# Patient Record
Sex: Female | Born: 1970 | Race: White | Hispanic: No | Smoking: Never smoker
Health system: Southern US, Community
[De-identification: ages and names within clinical notes are randomized; demographics above are authoritative.]

## PROBLEM LIST (undated history)

## (undated) ENCOUNTER — Inpatient Hospital Stay (HOSPITAL_COMMUNITY): Payer: 59

## (undated) DIAGNOSIS — F419 Anxiety disorder, unspecified: Secondary | ICD-10-CM

## (undated) DIAGNOSIS — R51 Headache: Secondary | ICD-10-CM

## (undated) DIAGNOSIS — R519 Headache, unspecified: Secondary | ICD-10-CM

## (undated) DIAGNOSIS — J45909 Unspecified asthma, uncomplicated: Secondary | ICD-10-CM

## (undated) HISTORY — PX: HERNIA REPAIR: SHX51

## (undated) HISTORY — PX: CERVICAL ABLATION: SHX5771

---

## 1995-07-27 HISTORY — PX: TUBAL LIGATION: SHX77

## 1996-07-26 HISTORY — PX: FOOT FRACTURE SURGERY: SHX645

## 1998-05-12 ENCOUNTER — Encounter: Admission: RE | Admit: 1998-05-12 | Discharge: 1998-05-12 | Payer: Self-pay | Admitting: Family Medicine

## 1998-05-27 ENCOUNTER — Encounter: Admission: RE | Admit: 1998-05-27 | Discharge: 1998-05-27 | Payer: Self-pay | Admitting: Sports Medicine

## 1998-07-14 ENCOUNTER — Encounter: Admission: RE | Admit: 1998-07-14 | Discharge: 1998-07-14 | Payer: Self-pay | Admitting: Sports Medicine

## 1998-07-14 ENCOUNTER — Other Ambulatory Visit: Admission: RE | Admit: 1998-07-14 | Discharge: 1998-07-14 | Payer: Self-pay | Admitting: *Deleted

## 1998-07-24 ENCOUNTER — Encounter: Admission: RE | Admit: 1998-07-24 | Discharge: 1998-07-24 | Payer: Self-pay | Admitting: Family Medicine

## 1998-07-29 ENCOUNTER — Encounter: Admission: RE | Admit: 1998-07-29 | Discharge: 1998-07-29 | Payer: Self-pay | Admitting: Sports Medicine

## 1998-08-26 ENCOUNTER — Encounter: Admission: RE | Admit: 1998-08-26 | Discharge: 1998-08-26 | Payer: Self-pay | Admitting: Family Medicine

## 1998-08-26 ENCOUNTER — Other Ambulatory Visit: Admission: RE | Admit: 1998-08-26 | Discharge: 1998-08-26 | Payer: Self-pay | Admitting: Family Medicine

## 1998-09-09 ENCOUNTER — Encounter: Admission: RE | Admit: 1998-09-09 | Discharge: 1998-09-09 | Payer: Self-pay | Admitting: Sports Medicine

## 1998-09-16 ENCOUNTER — Encounter: Admission: RE | Admit: 1998-09-16 | Discharge: 1998-09-16 | Payer: Self-pay | Admitting: Sports Medicine

## 1998-09-29 ENCOUNTER — Encounter: Admission: RE | Admit: 1998-09-29 | Discharge: 1998-09-29 | Payer: Self-pay | Admitting: Family Medicine

## 1998-10-28 ENCOUNTER — Encounter: Admission: RE | Admit: 1998-10-28 | Discharge: 1998-10-28 | Payer: Self-pay | Admitting: Family Medicine

## 1998-11-20 ENCOUNTER — Encounter: Admission: RE | Admit: 1998-11-20 | Discharge: 1998-11-20 | Payer: Self-pay | Admitting: Family Medicine

## 1999-01-14 ENCOUNTER — Encounter: Admission: RE | Admit: 1999-01-14 | Discharge: 1999-01-14 | Payer: Self-pay | Admitting: *Deleted

## 1999-02-17 ENCOUNTER — Encounter: Admission: RE | Admit: 1999-02-17 | Discharge: 1999-02-17 | Payer: Self-pay | Admitting: Family Medicine

## 1999-03-26 ENCOUNTER — Encounter: Admission: RE | Admit: 1999-03-26 | Discharge: 1999-03-26 | Payer: Self-pay | Admitting: Family Medicine

## 1999-04-07 ENCOUNTER — Encounter: Admission: RE | Admit: 1999-04-07 | Discharge: 1999-04-07 | Payer: Self-pay | Admitting: Family Medicine

## 1999-04-14 ENCOUNTER — Encounter: Admission: RE | Admit: 1999-04-14 | Discharge: 1999-04-14 | Payer: Self-pay | Admitting: Family Medicine

## 1999-05-15 ENCOUNTER — Encounter: Admission: RE | Admit: 1999-05-15 | Discharge: 1999-05-15 | Payer: Self-pay | Admitting: Family Medicine

## 1999-06-22 ENCOUNTER — Encounter: Admission: RE | Admit: 1999-06-22 | Discharge: 1999-06-22 | Payer: Self-pay | Admitting: Family Medicine

## 1999-06-23 ENCOUNTER — Encounter: Admission: RE | Admit: 1999-06-23 | Discharge: 1999-06-23 | Payer: Self-pay | Admitting: Family Medicine

## 1999-06-30 ENCOUNTER — Encounter: Admission: RE | Admit: 1999-06-30 | Discharge: 1999-06-30 | Payer: Self-pay | Admitting: Family Medicine

## 1999-08-18 ENCOUNTER — Other Ambulatory Visit: Admission: RE | Admit: 1999-08-18 | Discharge: 1999-08-18 | Payer: Self-pay | Admitting: Family Medicine

## 1999-08-18 ENCOUNTER — Encounter: Admission: RE | Admit: 1999-08-18 | Discharge: 1999-08-18 | Payer: Self-pay | Admitting: Family Medicine

## 2002-05-11 ENCOUNTER — Emergency Department (HOSPITAL_COMMUNITY): Admission: EM | Admit: 2002-05-11 | Discharge: 2002-05-11 | Payer: Self-pay | Admitting: Emergency Medicine

## 2002-05-11 ENCOUNTER — Encounter: Payer: Self-pay | Admitting: Emergency Medicine

## 2002-10-08 ENCOUNTER — Encounter: Payer: Self-pay | Admitting: Emergency Medicine

## 2002-10-08 ENCOUNTER — Emergency Department (HOSPITAL_COMMUNITY): Admission: EM | Admit: 2002-10-08 | Discharge: 2002-10-08 | Payer: Self-pay | Admitting: Emergency Medicine

## 2010-04-05 ENCOUNTER — Emergency Department (HOSPITAL_COMMUNITY): Admission: EM | Admit: 2010-04-05 | Discharge: 2010-04-05 | Payer: Self-pay | Admitting: Emergency Medicine

## 2011-03-18 ENCOUNTER — Emergency Department (HOSPITAL_COMMUNITY): Payer: 59

## 2011-03-18 ENCOUNTER — Emergency Department (HOSPITAL_COMMUNITY)
Admission: EM | Admit: 2011-03-18 | Discharge: 2011-03-18 | Disposition: A | Discharge status: Home / Self Care | Payer: 59 | Attending: Emergency Medicine | Admitting: Emergency Medicine

## 2011-03-18 ENCOUNTER — Encounter (HOSPITAL_COMMUNITY): Payer: Self-pay

## 2011-03-18 DIAGNOSIS — Y92410 Unspecified street and highway as the place of occurrence of the external cause: Secondary | ICD-10-CM | POA: Insufficient documentation

## 2011-03-18 DIAGNOSIS — I10 Essential (primary) hypertension: Secondary | ICD-10-CM | POA: Insufficient documentation

## 2011-03-18 DIAGNOSIS — K1370 Unspecified lesions of oral mucosa: Secondary | ICD-10-CM | POA: Insufficient documentation

## 2011-03-18 DIAGNOSIS — R110 Nausea: Secondary | ICD-10-CM | POA: Insufficient documentation

## 2011-03-18 DIAGNOSIS — K089 Disorder of teeth and supporting structures, unspecified: Secondary | ICD-10-CM | POA: Insufficient documentation

## 2011-03-18 DIAGNOSIS — T1490XA Injury, unspecified, initial encounter: Secondary | ICD-10-CM | POA: Insufficient documentation

## 2011-03-18 DIAGNOSIS — R109 Unspecified abdominal pain: Secondary | ICD-10-CM | POA: Insufficient documentation

## 2011-03-18 DIAGNOSIS — M25539 Pain in unspecified wrist: Secondary | ICD-10-CM | POA: Insufficient documentation

## 2011-03-18 DIAGNOSIS — M25529 Pain in unspecified elbow: Secondary | ICD-10-CM | POA: Insufficient documentation

## 2011-03-18 DIAGNOSIS — R079 Chest pain, unspecified: Secondary | ICD-10-CM | POA: Insufficient documentation

## 2011-03-18 MED ORDER — IOHEXOL 300 MG/ML  SOLN
100.0000 mL | Freq: Once | INTRAMUSCULAR | Status: AC | PRN
  Administered 2011-03-18: 100 mL via INTRAVENOUS

## 2011-06-01 ENCOUNTER — Encounter (HOSPITAL_COMMUNITY): Payer: Self-pay | Admitting: *Deleted

## 2011-07-07 ENCOUNTER — Encounter (HOSPITAL_COMMUNITY): Payer: Self-pay

## 2011-07-13 ENCOUNTER — Inpatient Hospital Stay (HOSPITAL_COMMUNITY): Admission: RE | Admit: 2011-07-13 | Payer: 59 | Source: Ambulatory Visit

## 2011-07-15 NOTE — H&P (Addendum)
Isabella Baker is an 40 y.o. female. G3P3 who is presenting for endometrial ablation for h/o dysmenorrhea and menorrhagia with heavy painful cycles lasting up to 10 days.  She has had a prior BTL.  Pertinent Gynecological History: OB History NSVD x 3   Menstrual History: Menarche age: 86 Patient's last menstrual period was 05/24/2011.    Past Medical History  Diagnosis Date  . Anxiety     Past Surgical History  Procedure Date  . Foot fracture surgery 1998  . Hernia repair     childhood  . Tubal ligation 1997    No family history on file.  Social History:  reports that she has never smoked. She does not have any smokeless tobacco history on file. She reports that she does not use illicit drugs. Her alcohol history not on file.  Allergies: No Known Allergies  No prescriptions prior to admission    ROS  Last menstrual period 05/24/2011. Physical Exam  Constitutional: She is oriented to person, place, and time. She appears well-developed and well-nourished.  Cardiovascular: Normal rate and regular rhythm.   Respiratory: Effort normal and breath sounds normal.  GI: Soft. Bowel sounds are normal.  Genitourinary: Vagina normal and uterus normal.  Neurological: She is alert and oriented to person, place, and time.  Psychiatric: She has a normal mood and affect. Her behavior is normal.    No results found for this or any previous visit (from the past 24 hour(s)).  No results found.  Assessment/Plan: Pt counseled re: procedure and risks/benfits.  She understands risks of bleeding infection and possible dmage to bowel and bladder and desires to proceed.  Isabella Baker 07/15/2011, 9:40 PM  Per pt no changes in dictated H&P, brief exam WNL.

## 2011-07-16 ENCOUNTER — Encounter (HOSPITAL_COMMUNITY): Payer: Self-pay | Admitting: Anesthesiology

## 2011-07-16 ENCOUNTER — Ambulatory Visit (HOSPITAL_COMMUNITY): Payer: 59 | Admitting: Anesthesiology

## 2011-07-16 ENCOUNTER — Other Ambulatory Visit: Payer: Self-pay | Admitting: Obstetrics and Gynecology

## 2011-07-16 ENCOUNTER — Encounter (HOSPITAL_COMMUNITY)
Admission: RE | Disposition: A | Discharge status: Home / Self Care | Payer: Self-pay | Source: Ambulatory Visit | Attending: Obstetrics and Gynecology

## 2011-07-16 ENCOUNTER — Ambulatory Visit (HOSPITAL_COMMUNITY)
Admission: RE | Admit: 2011-07-16 | Discharge: 2011-07-16 | Disposition: A | Discharge status: Home / Self Care | Payer: 59 | Source: Ambulatory Visit | Attending: Obstetrics and Gynecology | Admitting: Obstetrics and Gynecology

## 2011-07-16 DIAGNOSIS — Z9889 Other specified postprocedural states: Secondary | ICD-10-CM

## 2011-07-16 DIAGNOSIS — N946 Dysmenorrhea, unspecified: Secondary | ICD-10-CM | POA: Insufficient documentation

## 2011-07-16 DIAGNOSIS — N920 Excessive and frequent menstruation with regular cycle: Secondary | ICD-10-CM | POA: Insufficient documentation

## 2011-07-16 HISTORY — DX: Anxiety disorder, unspecified: F41.9

## 2011-07-16 LAB — CBC
HCT: 28.5 % — ABNORMAL LOW (ref 36.0–46.0)
Hemoglobin: 8.6 g/dL — ABNORMAL LOW (ref 12.0–15.0)
MCH: 23.4 pg — ABNORMAL LOW (ref 26.0–34.0)
MCV: 77.4 fL — ABNORMAL LOW (ref 78.0–100.0)
RBC: 3.68 MIL/uL — ABNORMAL LOW (ref 3.87–5.11)

## 2011-07-16 SURGERY — DILATATION & CURETTAGE/HYSTEROSCOPY WITH NOVASURE ABLATION
Anesthesia: General | Site: Vagina | Wound class: Clean Contaminated

## 2011-07-16 MED ORDER — MEPERIDINE HCL 25 MG/ML IJ SOLN: 6.2500 mg | INTRAMUSCULAR | Status: DC | PRN

## 2011-07-16 MED ORDER — LIDOCAINE HCL 1 % IJ SOLN
INTRAMUSCULAR | Status: DC | PRN
  Administered 2011-07-16: 10 mL

## 2011-07-16 MED ORDER — LACTATED RINGERS IV SOLN
INTRAVENOUS | Status: DC | PRN
  Administered 2011-07-16: 3000 mL via INTRAVENOUS

## 2011-07-16 MED ORDER — FENTANYL CITRATE 0.05 MG/ML IJ SOLN
25.0000 ug | INTRAMUSCULAR | Status: DC | PRN
  Administered 2011-07-16 (×2): 50 ug via INTRAVENOUS

## 2011-07-16 MED ORDER — FENTANYL CITRATE 0.05 MG/ML IJ SOLN
INTRAMUSCULAR | Status: AC
  Filled 2011-07-16: qty 2

## 2011-07-16 MED ORDER — KETOROLAC TROMETHAMINE 30 MG/ML IJ SOLN: 15.0000 mg | Freq: Once | INTRAMUSCULAR | Status: DC | PRN

## 2011-07-16 MED ORDER — IBUPROFEN 200 MG PO TABS: 600.0000 mg | ORAL_TABLET | Freq: Four times a day (QID) | ORAL | Status: DC | PRN

## 2011-07-16 MED ORDER — LACTATED RINGERS IV SOLN
INTRAVENOUS | Status: DC
  Administered 2011-07-16: 07:00:00 via INTRAVENOUS
  Administered 2011-07-16: 125 mL/h via INTRAVENOUS

## 2011-07-16 MED ORDER — ONDANSETRON HCL 4 MG/2ML IJ SOLN: 4.0000 mg | Freq: Once | INTRAMUSCULAR | Status: DC | PRN

## 2011-07-16 MED ORDER — FENTANYL CITRATE 0.05 MG/ML IJ SOLN
INTRAMUSCULAR | Status: DC | PRN
  Administered 2011-07-16: 100 ug via INTRAVENOUS

## 2011-07-16 MED ORDER — MIDAZOLAM HCL 5 MG/5ML IJ SOLN
INTRAMUSCULAR | Status: DC | PRN
  Administered 2011-07-16: 2 mg via INTRAVENOUS

## 2011-07-16 MED ORDER — KETOROLAC TROMETHAMINE 30 MG/ML IJ SOLN
INTRAMUSCULAR | Status: DC | PRN
  Administered 2011-07-16: 30 mg via INTRAVENOUS

## 2011-07-16 MED ORDER — MIDAZOLAM HCL 2 MG/2ML IJ SOLN
INTRAMUSCULAR | Status: AC
  Filled 2011-07-16: qty 2

## 2011-07-16 MED ORDER — LIDOCAINE HCL (CARDIAC) 20 MG/ML IV SOLN
INTRAVENOUS | Status: AC
  Filled 2011-07-16: qty 5

## 2011-07-16 MED ORDER — PROPOFOL 10 MG/ML IV EMUL
INTRAVENOUS | Status: DC | PRN
  Administered 2011-07-16: 200 mg via INTRAVENOUS

## 2011-07-16 MED ORDER — DEXAMETHASONE SODIUM PHOSPHATE 4 MG/ML IJ SOLN
INTRAMUSCULAR | Status: DC | PRN
  Administered 2011-07-16: 5 mg via INTRAVENOUS

## 2011-07-16 MED ORDER — PROPOFOL 10 MG/ML IV EMUL
INTRAVENOUS | Status: AC
  Filled 2011-07-16: qty 20

## 2011-07-16 MED ORDER — ONDANSETRON HCL 4 MG/2ML IJ SOLN
INTRAMUSCULAR | Status: DC | PRN
  Administered 2011-07-16: 4 mg via INTRAVENOUS

## 2011-07-16 MED ORDER — ONDANSETRON HCL 4 MG/2ML IJ SOLN
INTRAMUSCULAR | Status: AC
  Filled 2011-07-16: qty 2

## 2011-07-16 MED ORDER — LIDOCAINE HCL (CARDIAC) 20 MG/ML IV SOLN
INTRAVENOUS | Status: DC | PRN
  Administered 2011-07-16: 50 mg via INTRAVENOUS

## 2011-07-16 MED ORDER — DEXAMETHASONE SODIUM PHOSPHATE 10 MG/ML IJ SOLN
INTRAMUSCULAR | Status: AC
  Filled 2011-07-16: qty 1

## 2011-07-16 MED ORDER — KETOROLAC TROMETHAMINE 30 MG/ML IJ SOLN
INTRAMUSCULAR | Status: AC
  Filled 2011-07-16: qty 1

## 2011-07-16 SURGICAL SUPPLY — 16 items
ABLATOR ENDOMETRIAL BIPOLAR (ABLATOR) IMPLANT
CANISTER SUCTION 2500CC (MISCELLANEOUS) ×2 IMPLANT
CATH ROBINSON RED A/P 16FR (CATHETERS) ×2 IMPLANT
CLOTH BEACON ORANGE TIMEOUT ST (SAFETY) ×2 IMPLANT
CONTAINER PREFILL 10% NBF 60ML (FORM) ×4 IMPLANT
DRAPE BUTTOCK UNDER FLUID (DRAPE) ×2 IMPLANT
ELECT REM PT RETURN 9FT ADLT (ELECTROSURGICAL)
ELECTRODE REM PT RTRN 9FT ADLT (ELECTROSURGICAL) IMPLANT
GLOVE BIO SURGEON STRL SZ8 (GLOVE) ×2 IMPLANT
GLOVE ORTHO TXT STRL SZ7.5 (GLOVE) ×2 IMPLANT
GOWN PREVENTION PLUS LG XLONG (DISPOSABLE) ×2 IMPLANT
GOWN PREVENTION PLUS XLARGE (GOWN DISPOSABLE) ×2 IMPLANT
GOWN STRL REIN XL XLG (GOWN DISPOSABLE) ×2 IMPLANT
LOOP ANGLED CUTTING 22FR (CUTTING LOOP) IMPLANT
PACK HYSTEROSCOPY LF (CUSTOM PROCEDURE TRAY) ×2 IMPLANT
TOWEL OR 17X24 6PK STRL BLUE (TOWEL DISPOSABLE) ×4 IMPLANT

## 2011-07-16 NOTE — Anesthesia Postprocedure Evaluation (Signed)
Anesthesia Post Note  Patient: Isabella Baker  Procedure(s) Performed:  DILATATION & CURETTAGE/HYSTEROSCOPY WITH NOVASURE ABLATION  Anesthesia type: General  Patient location: PACU  Post pain: Pain level controlled  Post assessment: Post-op Vital signs reviewed  Last Vitals:  Filed Vitals:   07/16/11 0836  BP:   Pulse:   Temp: 37.1 C  Resp:     Post vital signs: Reviewed  Level of consciousness: sedated  Complications: No apparent anesthesia complications

## 2011-07-16 NOTE — Brief Op Note (Signed)
07/16/2011  8:04 AM  PATIENT:  Larose Hires  40 y.o. female  PRE-OPERATIVE DIAGNOSIS:  Menorrhagia  POST-OPERATIVE DIAGNOSIS:  Menorrhagia  PROCEDURE:  Procedure(s): DILATATION & CURETTAGE/HYSTEROSCOPY WITH NOVASURE ABLATION  SURGEON:  Surgeon(s): Oliver Pila     ANESTHESIA:   IV sedation and paracervical block  EBL:   minimal  BLOOD ADMINISTERED:none  DRAINS: none   LOCAL MEDICATIONS USED:  LIDOCAINE 20CC  SPECIMEN: Endometrial curretings  DISPOSITION OF SPECIMEN:  PATHOLOGY  COUNTS:  YES  DICTATION: .Dragon Dictation  PLAN OF CARE: Discharge to home after PACU  PATIENT DISPOSITION:  PACU - hemodynamically stable.

## 2011-07-16 NOTE — Transfer of Care (Signed)
Immediate Anesthesia Transfer of Care Note  Patient: Isabella Baker  Procedure(s) Performed:  DILATATION & CURETTAGE/HYSTEROSCOPY WITH NOVASURE ABLATION  Patient Location: PACU  Anesthesia Type: General  Level of Consciousness: awake, alert  and oriented  Airway & Oxygen Therapy: Patient Spontanous Breathing and Patient connected to nasal cannula oxygen  Post-op Assessment: Report given to PACU RN and Post -op Vital signs reviewed and stable  Post vital signs: Reviewed and stable  Complications: No apparent anesthesia complications

## 2011-07-16 NOTE — Anesthesia Preprocedure Evaluation (Signed)
Anesthesia Evaluation  Patient identified by MRN, date of birth, ID band Patient awake    Reviewed: Allergy & Precautions, H&P , NPO status , Patient's Chart, lab work & pertinent test results  Airway Mallampati: I TM Distance: >3 FB Neck ROM: full    Dental No notable dental hx. (+) Teeth Intact   Pulmonary neg pulmonary ROS,    Pulmonary exam normal       Cardiovascular neg cardio ROS     Neuro/Psych PSYCHIATRIC DISORDERS Anxiety Negative Neurological ROS     GI/Hepatic negative GI ROS, Neg liver ROS,   Endo/Other  Negative Endocrine ROS  Renal/GU negative Renal ROS  Genitourinary negative   Musculoskeletal negative musculoskeletal ROS (+)   Abdominal Normal abdominal exam  (+)   Peds negative pediatric ROS (+)  Hematology negative hematology ROS (+)   Anesthesia Other Findings   Reproductive/Obstetrics negative OB ROS (+) Pregnancy                           Anesthesia Physical Anesthesia Plan  ASA: II  Anesthesia Plan: General   Post-op Pain Management:    Induction:   Airway Management Planned: LMA  Additional Equipment:   Intra-op Plan:   Post-operative Plan:   Informed Consent: I have reviewed the patients History and Physical, chart, labs and discussed the procedure including the risks, benefits and alternatives for the proposed anesthesia with the patient or authorized representative who has indicated his/her understanding and acceptance.     Plan Discussed with: CRNA  Anesthesia Plan Comments:         Anesthesia Quick Evaluation

## 2011-07-16 NOTE — Op Note (Signed)
Operative note  Preoperative diagnosis Menorrhagia Dysmenorrhea  Postoperative diagnosis Same  Procedure Hysteroscopy with NovaSure ablation  Surgeon Dr. Huel Cote  Anesthesia LMA and paracervical block  Fluids Estimated blood loss minimal Urine output 50 cc straight catheter prior procedure IV fluids 800 cc LR Hysteroscopic deficit less than 50 cc  Findings A normal uterine cavity with no polyps or submucosal fibroids noted. Endometrial curettings were obtained.  Specimen  Endometrial curettings sent to pathology  Procedure note After informed consent was obtained from the patient she was taken to the operating room where LMA anesthesia was obtained without difficulty. An appropriate time out was performed and she was then prepped and draped in the normal sterile fashion in the dorsal lithotomy position. A speculum was placed within the vagina the cervix identified and anterior lip injected with approximately 2 cc of 1% plain lidocaine. An additional 9 cc each was placed at 2 and 10:00 for a paracervical block. The uterus was then easily sounded to 9 cm the cervix measured at 3 cm and the hysteroscope was introduced into the uterine cavity. Cavity was inspected with normal findings and the hysteroscope withdrawn. Endometrial curettings were obtained and sent to pathology. The NovaSure unit was then easily introduced into the uterine cavity and deployed with a cavity width of 4.6 noted. The safety test was performed and passed and the unit was activated with a cervical seal in place for a treatment time of 90 seconds. At the conclusion of the treatment the instrument was removed and the hysteroscope reintroduced  into the uterus. The endometrium appeared uniformly treated with no areas a viable endometrium noted. The hysteroscope was then removed. 2 small areas of bleeding on the anterior lip were treated with silver nitrate and the tenaculum was removed. The speculum was then  removed and the patient was taken to PACU in good condition. Sponge and instrument  counts were correct.

## 2012-04-17 ENCOUNTER — Inpatient Hospital Stay (HOSPITAL_COMMUNITY)
Admission: AD | Admit: 2012-04-17 | Discharge: 2012-04-17 | Disposition: A | Discharge status: Home / Self Care | Payer: 59 | Source: Ambulatory Visit | Attending: Obstetrics & Gynecology | Admitting: Obstetrics & Gynecology

## 2012-04-17 DIAGNOSIS — L293 Anogenital pruritus, unspecified: Secondary | ICD-10-CM | POA: Insufficient documentation

## 2012-04-17 DIAGNOSIS — R51 Headache: Secondary | ICD-10-CM | POA: Insufficient documentation

## 2012-04-17 DIAGNOSIS — B3731 Acute candidiasis of vulva and vagina: Secondary | ICD-10-CM | POA: Insufficient documentation

## 2012-04-17 DIAGNOSIS — R030 Elevated blood-pressure reading, without diagnosis of hypertension: Secondary | ICD-10-CM | POA: Insufficient documentation

## 2012-04-17 DIAGNOSIS — B373 Candidiasis of vulva and vagina: Secondary | ICD-10-CM

## 2012-04-17 LAB — WET PREP, GENITAL: Trich, Wet Prep: NONE SEEN

## 2012-04-17 MED ORDER — TERCONAZOLE 0.4 % VA CREA: 1.0000 | TOPICAL_CREAM | Freq: Every day | VAGINAL | Status: DC

## 2012-04-17 MED ORDER — FLUCONAZOLE 150 MG PO TABS: 150.0000 mg | ORAL_TABLET | Freq: Once | ORAL | Status: DC

## 2012-04-17 MED ORDER — NYSTATIN-TRIAMCINOLONE 100000-0.1 UNIT/GM-% EX OINT: TOPICAL_OINTMENT | Freq: Two times a day (BID) | CUTANEOUS | Status: DC

## 2012-04-17 MED ORDER — IBUPROFEN 600 MG PO TABS
600.0000 mg | ORAL_TABLET | Freq: Once | ORAL | Status: AC
  Administered 2012-04-17: 600 mg via ORAL
  Filled 2012-04-17: qty 1

## 2012-04-17 NOTE — MAU Provider Note (Signed)
History     CSN: [id number]  Arrival date and time: 04/17/12 0754   None     Chief Complaint  Patient presents with  . Vaginal Itching   HPI Isabella Baker is 41 y.o. presents with vaginal itching and irritation.  Is now on a second course of antibiotic for sinus infection given by.   Has been using Vagisil over the counter with increasing irritation.  Irritation interrupts sleep.  New sexual partner. He did not have  Circumcision.  Hx of of high blood pressure in past--she lost 50 lbs with diet and exercise and states her doctor said she no longer needed medication.  Patient has a hx of migraine and has had pain in her sinuses X 2 days. Points to pain as beginning under her eyes/nasal bridge to the top of the front part of her head.   She is alternating tylenol and advil.  Last tylenol  3:00am, last ibuprofen yesterday.      Past Medical History  Diagnosis Date  . Anxiety     Past Surgical History  Procedure Date  . Foot fracture surgery 1998  . Hernia repair     childhood  . Tubal ligation 1997    No family history on file.  History  Substance Use Topics  . Smoking status: Never Smoker   . Smokeless tobacco: Not on file  . Alcohol Use:     Allergies: No Known Allergies  Prescriptions prior to admission  Medication Sig Dispense Refill  . Alcaftadine (LASTACAFT) 0.25 % SOLN Apply 1 drop to eye daily as needed. For allergies       . calcium carbonate (TUMS - DOSED IN MG ELEMENTAL CALCIUM) 500 MG chewable tablet Chew 3 tablets by mouth daily as needed. For indigestion      . ibuprofen (ADVIL,MOTRIN) 200 MG tablet Take 3 tablets (600 mg total) by mouth every 6 (six) hours as needed. For cramps or headaches  30 tablet  1    Review of Systems  HENT:       + for sinus pain  Genitourinary:       + for vaginal irritation  Musculoskeletal: Negative.   Neurological: Negative.    Physical Exam   Blood pressure 167/106, pulse 88, temperature 98.2 F (36.8 C),  temperature source Oral, resp. rate 20, height 5' 2.5" (1.588 m), weight 72.576 kg (160 lb).  Physical Exam  Constitutional: She is oriented to person, place, and time. She appears well-developed and well-nourished. No distress.  HENT:  Head: Normocephalic.  Neck: Normal range of motion.  Cardiovascular: Normal rate.   Respiratory: Effort normal.  GI: Soft. She exhibits no distension and no mass. There is no tenderness. There is no rebound and no guarding.  Genitourinary: There is tenderness on the right labia. There is no lesion on the right labia. There is tenderness on the left labia. There is no lesion on the left labia. Uterus is tender (mild on exam). Uterus is not deviated and not enlarged. Cervix exhibits no motion tenderness, no discharge and no friability. Right adnexum displays no mass, no tenderness and no fullness. Left adnexum displays no mass, no tenderness and no fullness. There is erythema around the vagina. Vaginal discharge (small amount of white discharge without odor) found.       The labia minora are edematous and pink.  No lesions or ulceration.  This area of involvement extends behind the rectum.    Neurological: She is alert and oriented to person,  place, and time. No cranial nerve deficit. Coordination normal.  Skin: Skin is warm and dry.   Results for orders placed during the hospital encounter of 04/17/12 (from the past 24 hour(s))  WET PREP, GENITAL     Status: Abnormal   Collection Time   04/17/12  8:59 AM      Component Value Range   Yeast Wet Prep HPF POC NONE SEEN  NONE SEEN   Trich, Wet Prep NONE SEEN  NONE SEEN   Clue Cells Wet Prep HPF POC NONE SEEN  NONE SEEN   WBC, Wet Prep HPF POC MODERATE (*) NONE SEEN   Filed Vitals:   04/17/12 0809 04/17/12 0835 04/17/12 0937  BP: 167/106 159/101 156/98  Pulse: 88 82 86  Temp: 98.2 F (36.8 C)    TempSrc: Oral    Resp: 20 18   Height: 5' 2.5" (1.588 m)    Weight: 72.576 kg (160 lb)     MAU Course    Procedures  MDM   Patient last took tylenol at 3am.  Will give Ibuprofen 600mg  for headache.   Will wait to discharge when she gets relief of her headache to reevaluate her blood pressure.   9:55 patient is comfortable and understands Plan of care. 10:19  Patient states she is feeling better.  She is ready for discharge.  She needs to go to work. Assessment and Plan   A:  Yeast vaginitis secondary to antibiotic use      Recent sinus infection with 2 courses of antibiotics       Headache     Elevated blood pressure  P:  Rx for Diflucan, Terazol 7 and Nystatin cream sent to pharmacy    Strongly encouraged her to call her PCP to schedule appointment for blood pressure recheck--she agreed to do so.       KEY,EVE M 04/17/2012, 8:10 AM

## 2012-04-17 NOTE — MAU Note (Signed)
Hx of hypertension, is not on meds.  States saw spots on the way here.

## 2012-04-17 NOTE — MAU Provider Note (Signed)
Medical Screening exam and patient care preformed by advanced practice provider.  Agree with the above management.  

## 2012-04-17 NOTE — MAU Note (Signed)
Saw primary care MD. States was taking antibiotics for sinus infection and then for ?UTI. Started having vaginal itching, mostly on the outside. No discharge. States she is miserable. States she has new boyfriend and voices concerns that he is not circumcised.

## 2012-04-17 NOTE — MAU Note (Signed)
Vaginal irritation, thought it was yeast inf.  Had a sinus inf and was on antibiotics about a month ago. Treated self for yeast, has not improved. Is on 2nd antibiotic no, is getting worse.  No discharge or odor- but the itching is driving her crazy.

## 2012-04-19 LAB — GC/CHLAMYDIA PROBE AMP, GENITAL: Chlamydia, DNA Probe: POSITIVE — AB

## 2012-04-19 NOTE — MAU Note (Signed)
Patient had + Chlamydia result.  Email sent to be from [facility]arnie--patient request med called in to [facility] for treatment instead of her going to [facility].  Rx called in for AZithromycin 1gm po.  Jeani Sow, RN FNP

## 2014-04-25 ENCOUNTER — Ambulatory Visit: Payer: 59 | Admitting: Neurology

## 2014-04-25 ENCOUNTER — Telehealth: Payer: Self-pay | Admitting: Neurology

## 2014-04-25 NOTE — Telephone Encounter (Signed)
Patient no showed for an appointment today, 04/25/2014, at 1400.

## 2014-04-30 ENCOUNTER — Ambulatory Visit: Payer: 59 | Admitting: Neurology

## 2014-05-07 ENCOUNTER — Emergency Department (HOSPITAL_COMMUNITY)
Admission: EM | Admit: 2014-05-07 | Discharge: 2014-05-07 | Disposition: A | Discharge status: Home / Self Care | Payer: 59 | Attending: Emergency Medicine | Admitting: Emergency Medicine

## 2014-05-07 ENCOUNTER — Encounter (HOSPITAL_COMMUNITY): Payer: Self-pay | Admitting: Emergency Medicine

## 2014-05-07 ENCOUNTER — Emergency Department (HOSPITAL_COMMUNITY): Payer: 59

## 2014-05-07 DIAGNOSIS — S99921A Unspecified injury of right foot, initial encounter: Secondary | ICD-10-CM

## 2014-05-07 DIAGNOSIS — S9031XA Contusion of right foot, initial encounter: Secondary | ICD-10-CM | POA: Insufficient documentation

## 2014-05-07 DIAGNOSIS — Z79899 Other long term (current) drug therapy: Secondary | ICD-10-CM | POA: Diagnosis not present

## 2014-05-07 DIAGNOSIS — Y9289 Other specified places as the place of occurrence of the external cause: Secondary | ICD-10-CM | POA: Insufficient documentation

## 2014-05-07 DIAGNOSIS — Z9889 Other specified postprocedural states: Secondary | ICD-10-CM | POA: Insufficient documentation

## 2014-05-07 DIAGNOSIS — Y9389 Activity, other specified: Secondary | ICD-10-CM | POA: Diagnosis not present

## 2014-05-07 DIAGNOSIS — F419 Anxiety disorder, unspecified: Secondary | ICD-10-CM | POA: Insufficient documentation

## 2014-05-07 DIAGNOSIS — X58XXXA Exposure to other specified factors, initial encounter: Secondary | ICD-10-CM | POA: Diagnosis not present

## 2014-05-07 MED ORDER — HYDROCODONE-ACETAMINOPHEN 5-325 MG PO TABS: 1.0000 | ORAL_TABLET | Freq: Four times a day (QID) | ORAL | Status: DC | PRN

## 2014-05-07 NOTE — Discharge Instructions (Signed)
Contusion °A contusion is a deep bruise. Contusions are the result of an injury that caused bleeding under the skin. The contusion may turn blue, purple, or yellow. Minor injuries will give you a painless contusion, but more severe contusions may stay painful and swollen for a few weeks.  °CAUSES  °A contusion is usually caused by a blow, trauma, or direct force to an area of the body. °SYMPTOMS  °· Swelling and redness of the injured area. °· Bruising of the injured area. °· Tenderness and soreness of the injured area. °· Pain. °DIAGNOSIS  °The diagnosis can be made by taking a history and physical exam. An X-ray, CT scan, or MRI may be needed to determine if there were any associated injuries, such as fractures. °TREATMENT  °Specific treatment will depend on what area of the body was injured. In general, the best treatment for a contusion is resting, icing, elevating, and applying cold compresses to the injured area. Over-the-counter medicines may also be recommended for pain control. Ask your caregiver what the best treatment is for your contusion. °HOME CARE INSTRUCTIONS  °· Put ice on the injured area. °¨ Put ice in a plastic bag. °¨ Place a towel between your skin and the bag. °¨ Leave the ice on for 15-20 minutes, 3-4 times a day, or as directed by your health care provider. °· Only take over-the-counter or prescription medicines for pain, discomfort, or fever as directed by your caregiver. Your caregiver may recommend avoiding anti-inflammatory medicines (aspirin, ibuprofen, and naproxen) for 48 hours because these medicines may increase bruising. °· Rest the injured area. °· If possible, elevate the injured area to reduce swelling. °SEEK IMMEDIATE MEDICAL CARE IF:  °· You have increased bruising or swelling. °· You have pain that is getting worse. °· Your swelling or pain is not relieved with medicines. °MAKE SURE YOU:  °· Understand these instructions. °· Will watch your condition. °· Will get help right  away if you are not doing well or get worse. °Document Released: 04/21/2005 Document Revised: 07/17/2013 Document Reviewed: 05/17/2011 °[employer]® Patient Information ©2015 [employer]. This information is not intended to replace advice given to you by your health care provider. Make sure you discuss any questions you have with your health care provider. ° °

## 2014-05-07 NOTE — ED Notes (Signed)
Pt reports hx of right foot surgery with plates and screws. Pt reports her ankle is normally swollen, but not typically top of foot. Pt reports on Saturday, pt tripped forward and twisted and injured right foot. Pain 7/10. Pt reports she has been icing and elevation, but swelling to top of foot still present. Pedal pulse present.

## 2014-05-07 NOTE — ED Provider Notes (Signed)
CSN: [id number]     Arrival date & time 05/07/14  [id number] History   First MD Initiated Contact with Patient 05/07/14 [phone]     Chief Complaint  Patient presents with  . Foot Injury     (Consider location/radiation/quality/duration/timing/severity/associated sxs/prior Treatment) HPI  Larose Hiresheresa A Ohanian is a(n) 43 y.o. female who presents wot the Ed with cc of a R foot and ankle injury. Injury occurred yesterday. She stepped forward of of a step onto her Toes and hyper extended her foot and ankle. She has a previosu hx of ORIF of the foot from an accident. She c/o pain, and swelling. Pain is 7/10. She is concerned that she may have done damage to her previous repair and states she is having difficulty bearing weight. Denies numbness or tingling.  Past Medical History  Diagnosis Date  . Anxiety    Past Surgical History  Procedure Laterality Date  . Foot fracture surgery  1998  . Hernia repair      childhood  . Tubal ligation  1997   History reviewed. No pertinent family history. History  Substance Use Topics  . Smoking status: Never Smoker   . Smokeless tobacco: Not on file  . Alcohol Use:    OB History   Grav Para Term Preterm Abortions TAB SAB Ect Mult Living                 Review of Systems  Ten systems reviewed and are negative for acute change, except as noted in the HPI.    Allergies  Review of patient's allergies indicates no known allergies.  Home Medications   Prior to Admission medications   Medication Sig Start Date End Date Taking? Authorizing Provider  albuterol (PROVENTIL HFA;VENTOLIN HFA) 108 (90 BASE) MCG/ACT inhaler Inhale 2 puffs into the lungs every 4 (four) hours as needed for wheezing or shortness of breath.   Yes Historical Provider, MD  ALPRAZolam Prudy Feeler(XANAX) 0.5 MG tablet Take 0.5 mg by mouth 2 (two) times daily.    Yes Historical Provider, MD  butalbital-acetaminophen-caffeine (FIORICET, ESGIC) 50-325-40 MG per tablet Take 2 tablets by mouth every 4  (four) hours as needed for headache or migraine.   Yes Historical Provider, MD  HYDROcodone-acetaminophen (NORCO/VICODIN) 5-325 MG per tablet Take 1 tablet by mouth every 6 (six) hours as needed for moderate pain.   Yes Historical Provider, MD  ibuprofen (ADVIL,MOTRIN) 200 MG tablet Take 800 mg by mouth every 6 (six) hours as needed for moderate pain.   Yes Historical Provider, MD   BP 173/98  Pulse 71  Temp(Src) 98.8 F (37.1 C) (Oral)  Resp 16  SpO2 99% Physical Exam  Nursing note and vitals reviewed. Constitutional: She is oriented to person, place, and time. She appears well-developed and well-nourished. No distress.  HENT:  Head: Normocephalic and atraumatic.  Eyes: Conjunctivae are normal. No scleral icterus.  Neck: Normal range of motion.  Cardiovascular: Normal rate, regular rhythm and normal heart sounds.  Exam reveals no gallop and no friction rub.   No murmur heard. Pulmonary/Chest: Effort normal and breath sounds normal. No respiratory distress.  Abdominal: Soft. Bowel sounds are normal. She exhibits no distension and no mass. There is no tenderness. There is no guarding.  Musculoskeletal:  R foot with swelling across the midfoot on the dorsal surface. Minor ecchyomosis. TTP ANkle with full strength and ROM, NV intact.  Neurological: She is alert and oriented to person, place, and time.  Skin: Skin is warm and dry. She  is not diaphoretic.    ED Course  Procedures (including critical care time) Labs Review Labs Reviewed - No data to display  Imaging Review Dg Foot Complete Right  05/07/2014   CLINICAL DATA:  43 year old female tripped over a speed bump earlier this morning and now has acute onset pain of the right midfoot in the region of the tarsometatarsal joints.  EXAM: RIGHT FOOT COMPLETE - 3+ VIEW  COMPARISON:  Prior radiographs of the right ankle 02/23/2012  FINDINGS: Incompletely imaged post surgical changes of prior ORIF of a bimalleolar fracture. Two cannulated  lag screws traverse a healed medial malleolus and a lateral buttress plate and screw construct is noted in the distal fibular diaphysis.  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  No evidence of acute fracture or malalignment of the bones and joints of the foot. Normal bony mineralization. No lytic of blastic osseous lesion. Incidental note is made of a os peroneus an os tibialis externum. No ankle joint effusion. Soft tissue swelling is noted over the tarsometatarsal joints.  IMPRESSION: 1. Soft tissue swelling overlies the tarsometatarsal joints without evidence of acute fracture or malalignment. 2. 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   Electronically Signed   By: Malachy MoanHeath  McCullough M.D.   On: 05/07/2014 10:02     EKG Interpretation None      MDM   Final diagnoses:  Foot injury, right, initial encounter    Patient without evidence of acute fracture or dislocation Supportive care to include pain medicine, cam walker. F/u with her orthopedist.   Arthor CaptainAbigail Kenly Henckel, PA-C 05/12/14 [phone]

## 2014-05-07 NOTE — ED Notes (Signed)
Pt to xray

## 2014-05-07 NOTE — ED Notes (Signed)
Ortho tech called and made aware of order for cam walker

## 2014-05-07 NOTE — ED Notes (Signed)
OT at bedside. 

## 2014-05-07 NOTE — ED Notes (Signed)
Patient reports feeling better after placement of cam walker Patient denies further needs at time of DC Boyfriend to drive patient home

## 2014-05-13 NOTE — ED Provider Notes (Signed)
Medical screening examination/treatment/procedure(s) were performed by non-physician practitioner and as supervising physician I was immediately available for consultation/collaboration.   EKG Interpretation None       Neal Oshea R. Kemari Narez, MD 05/13/14 2352 

## 2015-05-17 ENCOUNTER — Other Ambulatory Visit: Payer: Self-pay | Admitting: Family Medicine

## 2015-05-17 DIAGNOSIS — Z1231 Encounter for screening mammogram for malignant neoplasm of breast: Secondary | ICD-10-CM

## 2015-07-29 ENCOUNTER — Ambulatory Visit
Admission: RE | Admit: 2015-07-29 | Discharge: 2015-07-29 | Disposition: A | Discharge status: Home / Self Care | Payer: 59 | Source: Ambulatory Visit | Attending: Family Medicine | Admitting: Family Medicine

## 2015-07-29 DIAGNOSIS — Z1231 Encounter for screening mammogram for malignant neoplasm of breast: Secondary | ICD-10-CM

## 2015-08-03 ENCOUNTER — Emergency Department (HOSPITAL_COMMUNITY): Payer: 59

## 2015-08-03 ENCOUNTER — Emergency Department (HOSPITAL_COMMUNITY)
Admission: EM | Admit: 2015-08-03 | Discharge: 2015-08-03 | Disposition: A | Discharge status: Home / Self Care | Payer: 59 | Attending: Emergency Medicine | Admitting: Emergency Medicine

## 2015-08-03 ENCOUNTER — Encounter (HOSPITAL_COMMUNITY): Payer: Self-pay

## 2015-08-03 DIAGNOSIS — J45901 Unspecified asthma with (acute) exacerbation: Secondary | ICD-10-CM | POA: Diagnosis not present

## 2015-08-03 DIAGNOSIS — F419 Anxiety disorder, unspecified: Secondary | ICD-10-CM | POA: Diagnosis not present

## 2015-08-03 DIAGNOSIS — Z79899 Other long term (current) drug therapy: Secondary | ICD-10-CM | POA: Diagnosis not present

## 2015-08-03 DIAGNOSIS — J111 Influenza due to unidentified influenza virus with other respiratory manifestations: Secondary | ICD-10-CM | POA: Diagnosis not present

## 2015-08-03 DIAGNOSIS — R0602 Shortness of breath: Secondary | ICD-10-CM | POA: Diagnosis present

## 2015-08-03 HISTORY — DX: Headache: R51

## 2015-08-03 HISTORY — DX: Unspecified asthma, uncomplicated: J45.909

## 2015-08-03 HISTORY — DX: Headache, unspecified: R51.9

## 2015-08-03 LAB — I-STAT CG4 LACTIC ACID, ED: LACTIC ACID, VENOUS: 1.19 mmol/L (ref 0.5–2.0)

## 2015-08-03 LAB — CBC WITH DIFFERENTIAL/PLATELET
Basophils Absolute: 0 10*3/uL (ref 0.0–0.1)
Basophils Relative: 1 %
EOS ABS: 0 10*3/uL (ref 0.0–0.7)
EOS PCT: 0 %
HCT: 41.5 % (ref 36.0–46.0)
Hemoglobin: 13.9 g/dL (ref 12.0–15.0)
LYMPHS ABS: 1.5 10*3/uL (ref 0.7–4.0)
Lymphocytes Relative: 25 %
MCH: 30.8 pg (ref 26.0–34.0)
MCHC: 33.5 g/dL (ref 30.0–36.0)
MCV: 91.8 fL (ref 78.0–100.0)
Monocytes Absolute: 0.9 10*3/uL (ref 0.1–1.0)
Monocytes Relative: 15 %
NEUTROS ABS: 3.5 10*3/uL (ref 1.7–7.7)
NEUTROS PCT: 59 %
PLATELETS: 235 10*3/uL (ref 150–400)
RBC: 4.52 MIL/uL (ref 3.87–5.11)
RDW: 12.8 % (ref 11.5–15.5)
WBC: 5.9 10*3/uL (ref 4.0–10.5)

## 2015-08-03 LAB — BASIC METABOLIC PANEL
Anion gap: 10 (ref 5–15)
BUN: 9 mg/dL (ref 6–20)
CHLORIDE: 104 mmol/L (ref 101–111)
CO2: 25 mmol/L (ref 22–32)
CREATININE: 0.87 mg/dL (ref 0.44–1.00)
Calcium: 8.6 mg/dL — ABNORMAL LOW (ref 8.9–10.3)
GFR calc Af Amer: >60 mL/min (ref >60)
Glucose, Bld: 110 mg/dL — ABNORMAL HIGH (ref 65–99)
Potassium: 3.3 mmol/L — ABNORMAL LOW (ref 3.5–5.1)
SODIUM: 139 mmol/L (ref 135–145)

## 2015-08-03 MED ORDER — METHYLPREDNISOLONE SODIUM SUCC 125 MG IJ SOLR
125.0000 mg | Freq: Once | INTRAMUSCULAR | Status: AC
  Administered 2015-08-03: 125 mg via INTRAVENOUS
  Filled 2015-08-03: qty 2

## 2015-08-03 MED ORDER — HYDROCODONE-ACETAMINOPHEN 5-325 MG PO TABS: 2.0000 | ORAL_TABLET | ORAL | Status: AC | PRN

## 2015-08-03 MED ORDER — SODIUM CHLORIDE 0.9 % IV BOLUS (SEPSIS)
1000.0000 mL | Freq: Once | INTRAVENOUS | Status: AC
  Administered 2015-08-03: 1000 mL via INTRAVENOUS

## 2015-08-03 MED ORDER — HYDROCODONE-ACETAMINOPHEN 5-325 MG PO TABS
2.0000 | ORAL_TABLET | Freq: Once | ORAL | Status: AC
  Administered 2015-08-03: 2 via ORAL
  Filled 2015-08-03: qty 2

## 2015-08-03 MED ORDER — ONDANSETRON HCL 4 MG/2ML IJ SOLN
4.0000 mg | Freq: Once | INTRAMUSCULAR | Status: AC
  Administered 2015-08-03: 4 mg via INTRAVENOUS
  Filled 2015-08-03: qty 2

## 2015-08-03 MED ORDER — IBUPROFEN 800 MG PO TABS
800.0000 mg | ORAL_TABLET | Freq: Once | ORAL | Status: AC
  Administered 2015-08-03: 800 mg via ORAL
  Filled 2015-08-03: qty 1

## 2015-08-03 MED ORDER — ALBUTEROL SULFATE (2.5 MG/3ML) 0.083% IN NEBU
2.5000 mg | INHALATION_SOLUTION | Freq: Once | RESPIRATORY_TRACT | Status: AC
  Administered 2015-08-03: 2.5 mg via RESPIRATORY_TRACT
  Filled 2015-08-03: qty 3

## 2015-08-03 MED ORDER — HYDROCODONE-ACETAMINOPHEN 5-325 MG PO TABS: 2.0000 | ORAL_TABLET | Freq: Once | ORAL | Status: AC

## 2015-08-03 MED ORDER — ACETAMINOPHEN 325 MG PO TABS
650.0000 mg | ORAL_TABLET | Freq: Once | ORAL | Status: AC
  Administered 2015-08-03: 650 mg via ORAL
  Filled 2015-08-03: qty 2

## 2015-08-03 MED ORDER — KETOROLAC TROMETHAMINE 30 MG/ML IJ SOLN
30.0000 mg | Freq: Once | INTRAMUSCULAR | Status: AC
  Administered 2015-08-03: 30 mg via INTRAVENOUS
  Filled 2015-08-03: qty 1

## 2015-08-03 NOTE — Discharge Instructions (Signed)

## 2015-08-03 NOTE — ED Provider Notes (Addendum)
CSN: [id number]     Arrival date & time 08/03/15  4098 History   First MD Initiated Contact with Patient 08/03/15 0900     Chief Complaint  Patient presents with  . Cough  . Shortness of Breath      HPI  Patient presents for evaluation of cough body aches and fever. Describes a three-day illness. She has been in the emergency room, and in the hospital with her admitted husband for the most part of the last 3 days.  Symptoms 2 days ago with headache and sore throat and body aches and fever. Worsening cough. Appetite no nausea vomiting or GI complaints. She presents here. She was not immunized for influenza.  Past Medical History  Diagnosis Date  . Anxiety   . Asthma   . Headache    Past Surgical History  Procedure Laterality Date  . Foot fracture surgery  1998  . Hernia repair      childhood  . Tubal ligation  1997  . Cervical ablation     Family History  Problem Relation Age of Onset  . COPD Mother    Social History  Substance Use Topics  . Smoking status: Never Smoker   . Smokeless tobacco: Never Used  . Alcohol Use: No   OB History    No data available     Review of Systems  Constitutional: Positive for fever and fatigue. Negative for chills, diaphoresis and appetite change.  HENT: Negative for mouth sores, sore throat and trouble swallowing.   Eyes: Negative for visual disturbance.  Respiratory: Positive for cough and shortness of breath. Negative for chest tightness and wheezing.   Cardiovascular: Negative for chest pain.  Gastrointestinal: Negative for nausea, vomiting, abdominal pain, diarrhea and abdominal distention.  Endocrine: Negative for polydipsia, polyphagia and polyuria.  Genitourinary: Negative for dysuria, frequency and hematuria.  Musculoskeletal: Positive for myalgias. Negative for gait problem.  Skin: Negative for color change, pallor and rash.  Neurological: Negative for dizziness, syncope, light-headedness and headaches.  Hematological:  Does not bruise/bleed easily.  Psychiatric/Behavioral: Negative for behavioral problems and confusion.      Allergies  Review of patient's allergies indicates no known allergies.  Home Medications   Prior to Admission medications   Medication Sig Start Date End Date Taking? Authorizing Provider  albuterol (PROVENTIL HFA;VENTOLIN HFA) 108 (90 BASE) MCG/ACT inhaler Inhale 2 puffs into the lungs every 4 (four) hours as needed for wheezing or shortness of breath.   Yes Historical Provider, MD  ALPRAZolam Prudy Feeler) 0.5 MG tablet Take 0.5 mg by mouth 2 (two) times daily.    Yes Historical Provider, MD  butalbital-acetaminophen-caffeine (FIORICET, ESGIC) 50-325-40 MG per tablet Take 2 tablets by mouth every 4 (four) hours as needed for headache or migraine.   Yes Historical Provider, MD  ibuprofen (ADVIL,MOTRIN) 200 MG tablet Take 800 mg by mouth every 6 (six) hours as needed for moderate pain.   Yes Historical Provider, MD  HYDROcodone-acetaminophen (NORCO/VICODIN) 5-325 MG tablet Take 2 tablets by mouth every 4 (four) hours as needed. 08/03/15   Rolland Porter, MD   BP 115/80 mmHg  Pulse 92  Temp(Src) 98.7 F (37.1 C) (Oral)  Resp 20  SpO2 96% Physical Exam  Constitutional: She is oriented to person, place, and time. She appears well-developed and well-nourished. No distress.  HENT:  Head: Normocephalic.  Eyes: Conjunctivae are normal. Pupils are equal, round, and reactive to light. No scleral icterus.  Neck: Normal range of motion. Neck supple. No thyromegaly present.  Cardiovascular: Normal rate and regular rhythm.  Exam reveals no gallop and no friction rub.   No murmur heard. Pulmonary/Chest: Effort normal and breath sounds normal. No respiratory distress. She has no wheezes. She has no rales.  Mild end expiratory wheeze.  No focal signs of consolidation  Abdominal: Soft. Bowel sounds are normal. She exhibits no distension. There is no tenderness. There is no rebound.  Musculoskeletal:  Normal range of motion.  Neurological: She is alert and oriented to person, place, and time.  Skin: Skin is warm and dry. No rash noted.  Psychiatric: She has a normal mood and affect. Her behavior is normal.    ED Course  Procedures (including critical care time) Labs Review Labs Reviewed  BASIC METABOLIC PANEL - Abnormal; Notable for the following:    Potassium 3.3 (*)    Glucose, Bld 110 (*)    Calcium 8.6 (*)    All other components within normal limits  CBC WITH DIFFERENTIAL/PLATELET  I-STAT CG4 LACTIC ACID, ED    Imaging Review Dg Chest 2 View  08/03/2015  CLINICAL DATA:  Nonproductive cough for 3 days.  History of asthma. EXAM: CHEST  2 VIEW COMPARISON:  03/18/2011 FINDINGS: The cardiomediastinal silhouette is within normal limits. The lungs are well inflated and clear. There is no evidence of pleural effusion or pneumothorax. No acute osseous abnormality is identified. Surgical clips are noted in the upper abdomen. IMPRESSION: No active cardiopulmonary disease. Electronically Signed   By: Sebastian AcheAllen  Grady M.D.   On: 08/03/2015 10:21   I have personally reviewed and evaluated these images and lab results as part of my medical decision-making.   EKG Interpretation   Date/Time:  Sunday August 03 2015 08:53:55 EST Ventricular Rate:  94 PR Interval:  110 QRS Duration: 82 QT Interval:  357 QTC Calculation: 446 R Axis:   73 Text Interpretation:  Sinus rhythm Borderline short PR interval Right  atrial enlargement Confirmed by Fayrene FearingJAMES  MD, Christabel Camire ([id number]) on 08/03/2015  9:01:33 AM      MDM   Final diagnoses:  Influenza    Not hypoxemic. Heart rate, and temperature improved after treatment. Taking by mouth without difficulty. Plan will be home. Symptomatically treatment.    Rolland PorterMark Kurk Corniel, MD 08/03/15 1221  Rolland PorterMark Tanicka Bisaillon, MD 08/03/15 [phone]

## 2015-08-03 NOTE — ED Notes (Signed)
Patient c/o productive cough with yellow sputum and body aches, and both ears.

## 2015-08-04 ENCOUNTER — Telehealth: Payer: Self-pay | Admitting: *Deleted

## 2016-08-16 IMAGING — CR DG CHEST 2V
2 series · 2 of 2 positions shown · non-contrast
Comparison: 03/18/2011

CLINICAL DATA: Nonproductive cough for 3 days.  History of asthma.

EXAM:
CHEST  2 VIEW

[w chest pa]
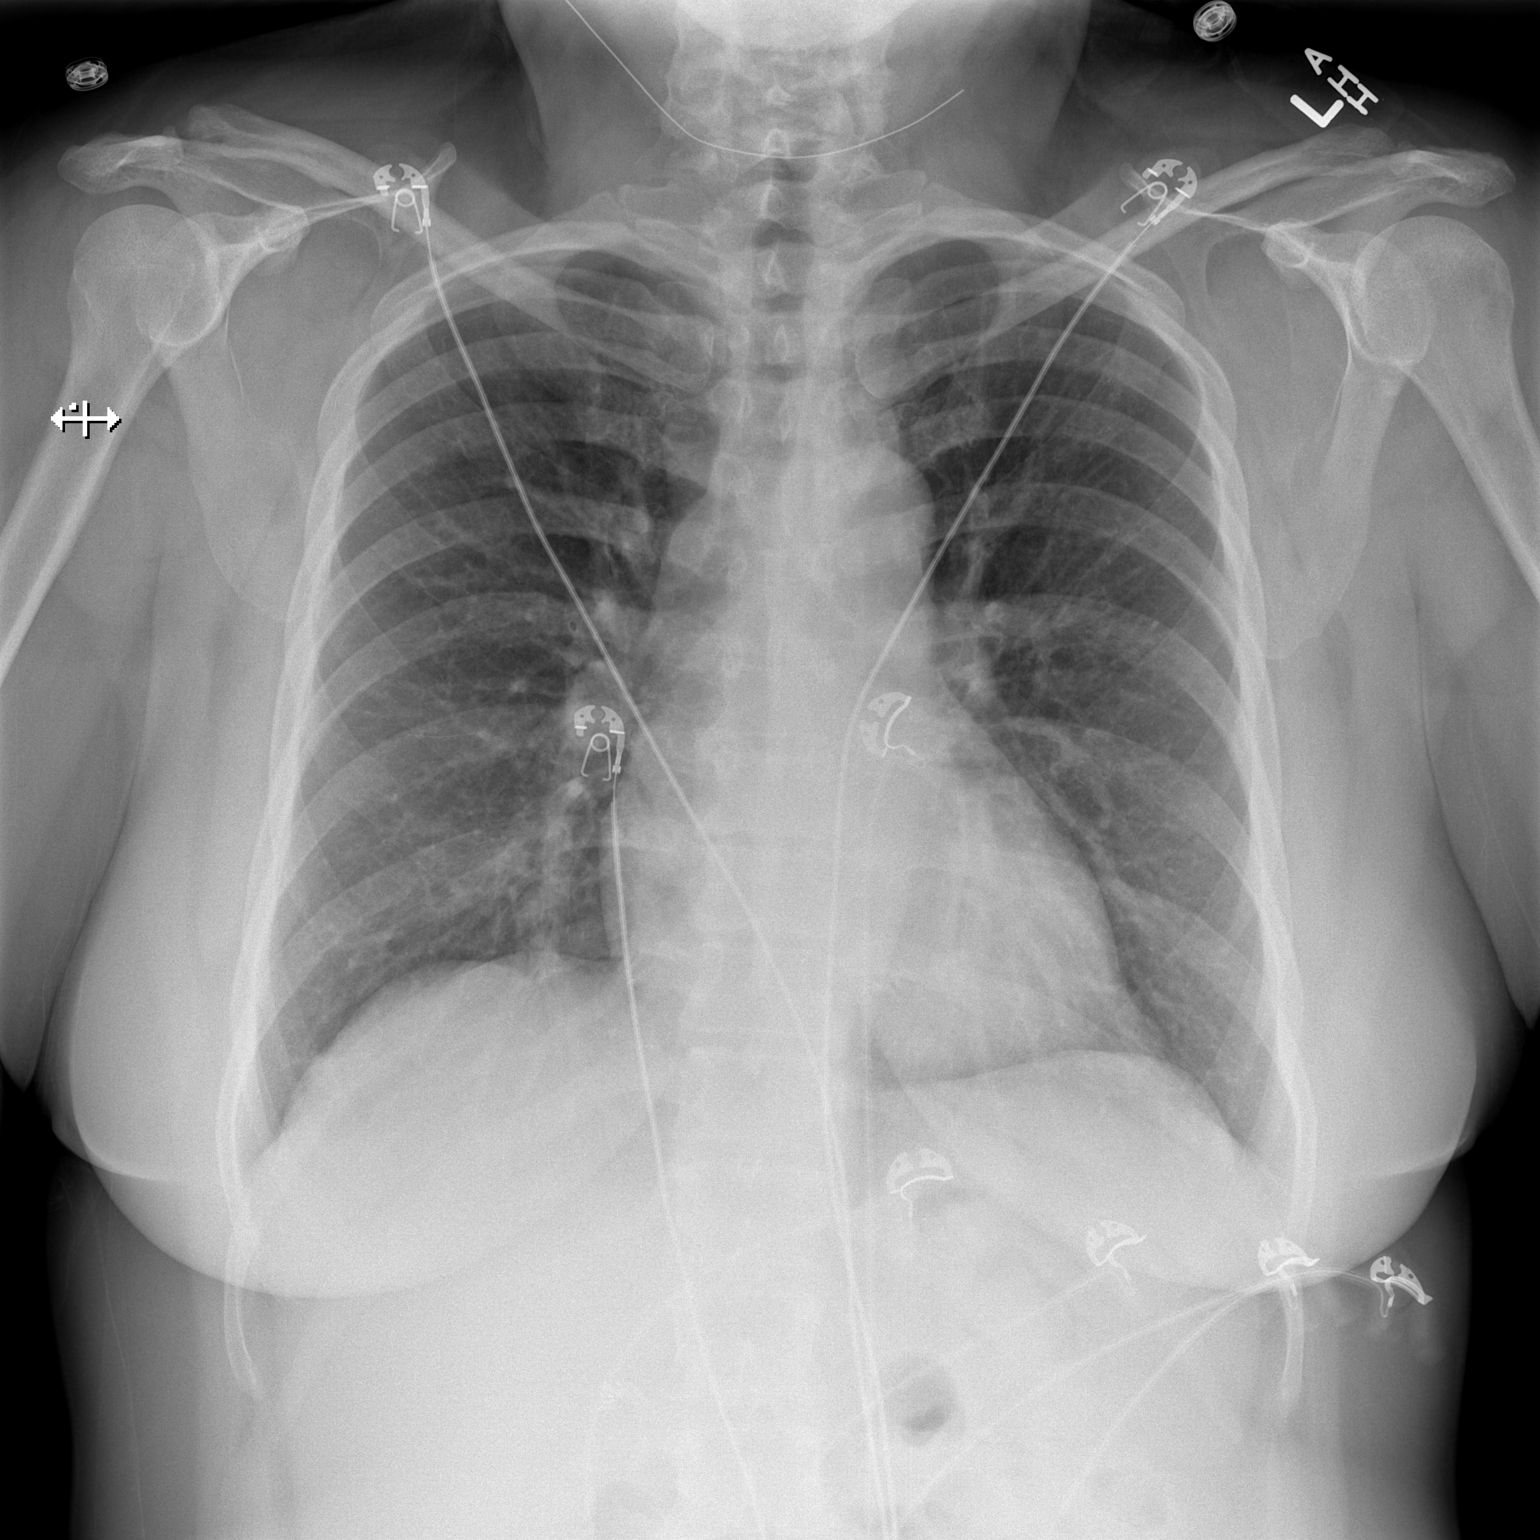

[w chest lat]
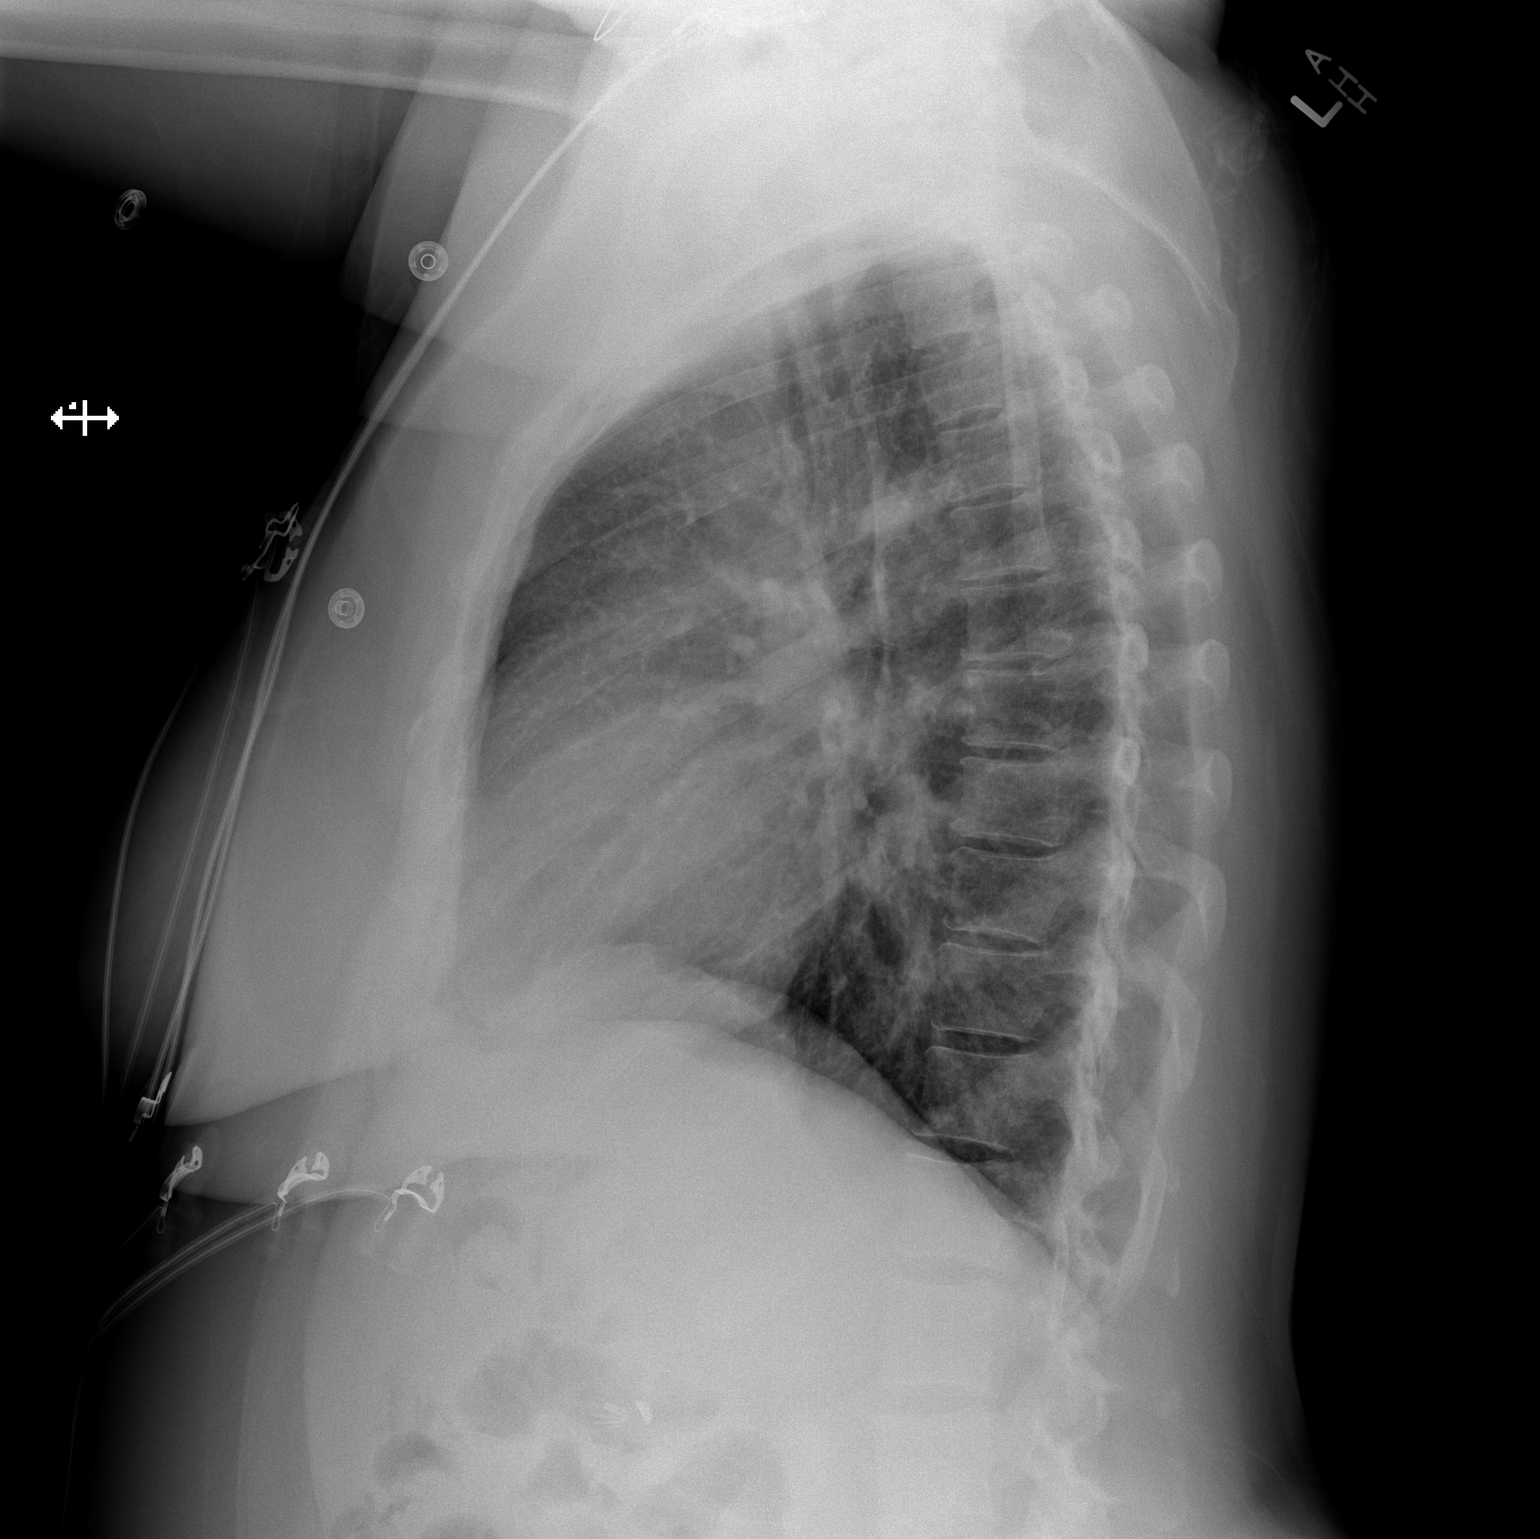

[2 of 2 positions shown; findings below may reference images not displayed]

FINDINGS: The cardiomediastinal silhouette is within normal limits. The lungs
are well inflated and clear. There is no evidence of pleural
effusion or pneumothorax. No acute osseous abnormality is
identified. Surgical clips are noted in the upper abdomen.
IMPRESSION: No active cardiopulmonary disease.
